# Patient Record
Sex: Male | Born: 2000 | Race: Black or African American | Hispanic: No | Marital: Single | Smoking: Never smoker
Health system: Southern US, Community
[De-identification: ages and names within clinical notes are randomized; demographics above are authoritative.]

## PROBLEM LIST (undated history)

## (undated) HISTORY — PX: OTHER SURGICAL HISTORY: SHX169

---
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
## 2022-07-13 ENCOUNTER — Telehealth: Payer: Self-pay

## 2022-07-13 ENCOUNTER — Other Ambulatory Visit: Payer: Self-pay

## 2022-07-13 DIAGNOSIS — K580 Irritable bowel syndrome with diarrhea: Secondary | ICD-10-CM

## 2022-07-13 NOTE — Telephone Encounter (Signed)
-----   Message from Chrystie Nose, RN sent at 07/10/2021  4:33 PM EDT ----- Regarding: CBC Pt needs a CBC

## 2022-07-13 NOTE — Telephone Encounter (Signed)
Order in epic and pts mother aware.

## 2022-07-27 ENCOUNTER — Other Ambulatory Visit (INDEPENDENT_AMBULATORY_CARE_PROVIDER_SITE_OTHER): Payer: 59

## 2022-07-27 DIAGNOSIS — K580 Irritable bowel syndrome with diarrhea: Secondary | ICD-10-CM

## 2022-07-27 LAB — CBC WITH DIFFERENTIAL/PLATELET
Basophils Absolute: 0 10*3/uL (ref 0.0–0.1)
Basophils Relative: 0.6 % (ref 0.0–3.0)
Eosinophils Absolute: 0 10*3/uL (ref 0.0–0.7)
Eosinophils Relative: 0.3 % (ref 0.0–5.0)
HCT: 50.5 % — ABNORMAL HIGH (ref 36.0–49.0)
Hemoglobin: 17 g/dL — ABNORMAL HIGH (ref 12.0–16.0)
Lymphocytes Relative: 32 % (ref 24.0–48.0)
Lymphs Abs: 2 10*3/uL (ref 0.7–4.0)
MCHC: 33.7 g/dL (ref 31.0–37.0)
MCV: 83.4 fl (ref 78.0–98.0)
Monocytes Absolute: 0.5 10*3/uL (ref 0.1–1.0)
Monocytes Relative: 8 % (ref 3.0–12.0)
Neutro Abs: 3.7 10*3/uL (ref 1.4–7.7)
Neutrophils Relative %: 59.1 % (ref 43.0–71.0)
Platelets: 247 10*3/uL (ref 150.0–575.0)
RBC: 6.05 Mil/uL — ABNORMAL HIGH (ref 3.80–5.70)
RDW: 12.9 % (ref 11.4–15.5)
WBC: 6.3 10*3/uL (ref 4.5–13.5)

## 2022-07-30 NOTE — Progress Notes (Signed)
Caryn Bee,  Your white blood cell count is now normal, but your hemoglobin level was high.  This does not necessarily have any significance, but if it is persistently high, further evaluation may be required.  I would like to repeat a CBC in 4 months.  If your hemoglobin level is still high, we may refer you to a blood specialist.  Maya,  Can you relay the above message to the patient?

## 2022-07-31 ENCOUNTER — Other Ambulatory Visit: Payer: Self-pay

## 2022-07-31 DIAGNOSIS — D582 Other hemoglobinopathies: Secondary | ICD-10-CM

## 2024-01-18 ENCOUNTER — Other Ambulatory Visit (HOSPITAL_COMMUNITY): Payer: Self-pay

## 2024-01-18 MED ORDER — AMOXICILLIN 875 MG PO TABS
875.0000 mg | ORAL_TABLET | Freq: Two times a day (BID) | ORAL | 0 refills | Status: AC
  Filled 2024-01-18: qty 20, 10d supply, fill #0
